# Patient Record
Sex: Female | Born: 1975 | Race: White | Hispanic: No | Marital: Married
Health system: Midwestern US, Community
[De-identification: ages and names within clinical notes are randomized; demographics above are authoritative.]

## PROBLEM LIST (undated history)

## (undated) DIAGNOSIS — Z1231 Encounter for screening mammogram for malignant neoplasm of breast: Principal | ICD-10-CM

---

## 2016-04-23 ENCOUNTER — Ambulatory Visit: Attending: Family | Primary: Family

## 2016-04-23 ENCOUNTER — Inpatient Hospital Stay: Admit: 2016-04-23 | Payer: TRICARE (CHAMPUS) | Primary: Family

## 2016-04-23 ENCOUNTER — Ambulatory Visit: Admit: 2016-04-23 | Discharge: 2016-04-23 | Payer: PRIVATE HEALTH INSURANCE | Attending: Family | Primary: Family

## 2016-04-23 DIAGNOSIS — Z7689 Persons encountering health services in other specified circumstances: Secondary | ICD-10-CM

## 2016-04-23 DIAGNOSIS — F339 Major depressive disorder, recurrent, unspecified: Secondary | ICD-10-CM

## 2016-04-23 MED ORDER — ESCITALOPRAM 20 MG TAB
20 mg | ORAL_TABLET | Freq: Every day | ORAL | 0 refills | Status: AC
Start: 2016-04-23 — End: ?

## 2016-04-23 NOTE — Patient Instructions (Signed)
Bipolar Disorder: Care Instructions  Your Care Instructions  Bipolar disorder is an illness that causes extreme mood changes, from times of very high energy (manic episodes) to times of depression. But many people with bipolar disorder show only the symptoms of depression. These moods may cause problems with your work, school, family life, friendships, and how well you function.  This disease is also called manic-depression.  There is no cure for bipolar disorder, but it can be helped with medicines. Counseling may also help. It is important to take your medicines exactly as prescribed, even when you feel well. You may need lifelong treatment.  Follow-up care is a key part of your treatment and safety. Be sure to make and go to all appointments, and call your doctor if you are having problems. It's also a good idea to know your test results and keep a list of the medicines you take.  How can you care for yourself at home?  ?? Be safe with medicines. Take your medicines exactly as prescribed. Do not stop or change a medicine without talking to your doctor first. You and your doctor may need to try different combinations of medicines to find what works for you.  ?? Take your medicines on schedule to keep your moods even. When you feel good, you may think that you do not need your medicines. But it is important to keep taking them.  ?? Go to your counseling sessions. Call and talk with your counselor if you can't go to a session or if you don't think the sessions are helping. Do not just stop going.  ?? Get at least 30 minutes of activity on most days of the week. Walking is a good choice. You also may want to do other things, such as running, swimming, or cycling.  ?? Get enough sleep. Keep your room dark and quiet. Try to go to bed at the same time every night.  ?? Eat a healthy diet. This includes whole grains, dairy, fruits, vegetables, and protein. Eat foods from each of these groups.   ?? Try to lower your stress. Manage your time, build a strong support system, and lead a healthy lifestyle. To lower your stress, try physical activity, slow deep breathing, or getting a massage.  ?? Do not use alcohol or illegal drugs.  ?? Learn the early signs of your mood changes. You can then take steps to help yourself feel better.  ?? Ask for help from friends and family when you need it. You may need help with daily chores when you are depressed. When you are manic, you may need support to control your high energy levels.  What should you do if someone in your family has bipolar disorder?  ?? Learn about the disease and the signs that it is getting worse.  ?? Remind your family member that you love him or her.  ?? Make a plan with all family members about how to take care of your loved one when his or her symptoms are bad.  ?? Talk about your fears and concerns and those of other family members. Seek counseling if needed.  ?? Do not focus attention only on the person who is in treatment.  ?? Remind yourself that it will take time for changes to occur.  ?? Do not blame yourself for the disease.  ?? Know your legal rights and the legal rights of your family member. Support groups or counselors can help you with this information.  ?? Take   care of yourself. Keep up with your own interests, such as your career, hobbies, and friends. Use exercise, positive self-talk, deep breathing, and other relaxing exercises to help lower your stress.  ?? Give yourself time to grieve. You may need to deal with emotions such as anger, fear, and frustration. After you work through your feelings, you will be better able to care for yourself and your family.  ?? If you are having a hard time with your feelings or with your relationship with your family member, talk with a counselor.  When should you call for help?  Call 911 anytime you think you may need emergency care. For example, call if:  ?? You feel like hurting yourself or someone else.   ?? Someone who has bipolar disorder displays dangerous behavior, and you think the person might hurt himself or herself or someone else.  Call your doctor now or seek immediate medical care if:  ?? You hear voices.  ?? Someone you know has bipolar disorder and talks about suicide. Keep the numbers for these national suicide hotlines: 1-800-273-TALK (763) 796-9479(1-602-805-2064) and 1-800-SUICIDE 334-068-1838(1-907-793-2069). If a suicide threat seems real, with a specific plan and a way to carry it out, stay with the person, or ask someone you trust to stay with the person, until you can get help.  ?? Someone you know has bipolar disorder and:  ?? Starts to give away possessions.  ?? Is using illegal drugs or drinking alcohol heavily.  ?? Talks or writes about death, including writing suicide notes or talking about guns, knives, or pills.  ?? Talks or writes about hurting someone else.  ?? Starts to spend a lot of time alone.  ?? Acts very aggressively or suddenly appears calm.  ?? Talks about beliefs that are not based in reality (delusions).  Watch closely for changes in your health, and be sure to contact your doctor if:  ?? You cannot go to your counseling sessions.  Where can you learn more?  Go to [employer]cahttp://www.healthwise.net/[employer]Help[employer].  Enter K052 in the search box to learn more about "Bipolar Disorder: Care Instructions."  Current as of: January 29, 2015  Content Version: 11.3  ?? 2006-2017 [employer]. Care instructions adapted under license by [employer] (which disclaims liability or warranty for this information). If you have questions about a medical condition or this instruction, always ask your healthcare professional. [employer] disclaims any warranty or liability for your use of this information.       Anxiety Disorder: Care Instructions  Your Care Instructions  Anxiety is a normal reaction to stress. Difficult situations can cause you to have symptoms such as sweaty palms and a nervous feeling.   In an anxiety disorder, the symptoms are far more severe. Constant worry, muscle tension, trouble sleeping, nausea and diarrhea, and other symptoms can make normal daily activities difficult or impossible. These symptoms may occur for no reason, and they can affect your work, school, or social life. Medicines, counseling, and self-care can all help.  Follow-up care is a key part of your treatment and safety. Be sure to make and go to all appointments, and call your doctor if you are having problems. It's also a good idea to know your test results and keep a list of the medicines you take.  How can you care for yourself at home?  ?? Take medicines exactly as directed. Call your doctor if you think you are having a problem with your medicine.  ?? Go to your  counseling sessions and follow-up appointments.  ?? Recognize and accept your anxiety. Then, when you are in a situation that makes you anxious, say to yourself, "This is not an emergency. I feel uncomfortable, but I am not in danger. I can keep going even if I feel anxious."  ?? Be kind to your body:  ?? Relieve tension with exercise or a massage.  ?? Get enough rest.  ?? Avoid alcohol, caffeine, nicotine, and illegal drugs. They can increase your anxiety level and cause sleep problems.  ?? Learn and do relaxation techniques. See below for more about these techniques.  ?? Engage your mind. Get out and do something you enjoy. Go to a funny movie, or take a walk or hike. Plan your day. Having too much or too little to do can make you anxious.  ?? Keep a record of your symptoms. Discuss your fears with a good friend or family member, or join a support group for people with similar problems. Talking to others sometimes relieves stress.  ?? Get involved in social groups, or volunteer to help others. Being alone sometimes makes things seem worse than they are.  ?? Get at least 30 minutes of exercise on most days of the week to relieve  stress. Walking is a good choice. You also may want to do other activities, such as running, swimming, cycling, or playing tennis or team sports.  Relaxation techniques  Do relaxation exercises 10 to 20 minutes a day. You can play soothing, relaxing music while you do them, if you wish.  ?? Tell others in your house that you are going to do your relaxation exercises. Ask them not to disturb you.  ?? Find a comfortable place, away from all distractions and noise.  ?? Lie down on your back, or sit with your back straight.  ?? Focus on your breathing. Make it slow and steady.  ?? Breathe in through your nose. Breathe out through either your nose or mouth.  ?? Breathe deeply, filling up the area between your navel and your rib cage. Breathe so that your belly goes up and down.  ?? Do not hold your breath.  ?? Breathe like this for 5 to 10 minutes. Notice the feeling of calmness throughout your whole body.  As you continue to breathe slowly and deeply, relax by doing the following for another 5 to 10 minutes:  ?? Tighten and relax each muscle group in your body. You can begin at your toes and work your way up to your head.  ?? Imagine your muscle groups relaxing and becoming heavy.  ?? Empty your mind of all thoughts.  ?? Let yourself relax more and more deeply.  ?? Become aware of the state of calmness that surrounds you.  ?? When your relaxation time is over, you can bring yourself back to alertness by moving your fingers and toes and then your hands and feet and then stretching and moving your entire body. Sometimes people fall asleep during relaxation, but they usually wake up shortly afterward.  ?? Always give yourself time to return to full alertness before you drive a car or do anything that might cause an accident if you are not fully alert. Never play a relaxation tape while you drive a car.  When should you call for help?  Call 911 anytime you think you may need emergency care. For example, call if:   ?? You feel you cannot stop from hurting yourself or someone else.  Keep the  numbers for these national suicide hotlines: 1-800-273-TALK 478-109-5191(1-3345563348) and 1-800-SUICIDE (432)536-3642(1-4433696358). If you or someone you know talks about suicide or feeling hopeless, get help right away.  Watch closely for changes in your health, and be sure to contact your doctor if:  ?? You have anxiety or fear that affects your life.  ?? You have symptoms of anxiety that are new or different from those you had before.  Where can you learn more?  Go to [employer]cahttp://www.healthwise.net/GoodHelpConnections.  Enter P754 in the search box to learn more about "Anxiety Disorder: Care Instructions."  Current as of: January 29, 2015  Content Version: 11.3  ?? 2006-2017 [employer]. Care instructions adapted under license by [employer] (which disclaims liability or warranty for this information). If you have questions about a medical condition or this instruction, always ask your healthcare professional. [employer] disclaims any warranty or liability for your use of this information.  Escitalopram (By mouth)   Escitalopram (es-sye-TAL-oh-pram)  Treats depression and generalized anxiety disorder (GAD).   Brand Name(s): Lexapro   There may be other brand names for this medicine.  When This Medicine Should Not Be Used:   This medicine is not right for everyone. Do not use it if you had an allergic reaction to escitalopram or citalopram.  How to Use This Medicine:   Liquid, Tablet  ?? Take this medicine as directed. You may need to take it for a month or more before you feel better. Your dose may need to be changed to find out what works best for you.  ?? Measure the oral liquid medicine with a marked measuring spoon, oral syringe, or medicine cup.  ?? This medicine should come with a Medication Guide. Ask your pharmacist for a copy if you do not have one.  ?? Missed dose: Take a dose as soon as you remember. If it is almost time  for your next dose, wait until then and take a regular dose. Do not take extra medicine to make up for a missed dose.  ?? Store the medicine in a closed container at room temperature, away from heat, moisture, and direct light.  Drugs and Foods to Avoid:   Ask your doctor or pharmacist before using any other medicine, including over-the-counter medicines, vitamins, and herbal products.  ?? Do not use this medicine together with pimozide. Do not use this medicine and an MAO inhibitor (MAOI) within 14 days of each other.  ?? Some medicines can affect how escitalopram works. Tell your doctor if you are using the following:   ?? Buspirone, carbamazepine, fentanyl, lithium, St John's wort, tramadol, or tryptophan supplements  ?? Amphetamines  ?? Blood thinner (including warfarin)  ?? Diuretic (water pill)  ?? NSAID pain or arthritis medicine (including aspirin, celecoxib, diclofenac, ibuprofen, naproxen)  ?? Triptan medicine to treat migraine headaches (including sumatriptan)  ?? Tell your doctor if you use anything else that makes you sleepy. Some examples are allergy medicine, narcotic pain medicine, and alcohol.  ?? Do not drink alcohol while you are using this medicine.  Warnings While Using This Medicine:   ?? Tell your doctor if you are pregnant or breastfeeding, or if you have kidney disease, liver disease, bleeding problems, glaucoma, heart disease, or a seizure disorder.  ?? For some children, teenagers, and young adults, this medicine may increase mental or emotional problems. This may lead to thoughts of suicide and violence. Talk with your doctor right away if you have any thoughts or behavior changes that  concern you. Tell your doctor if you or anyone in your family has a history of bipolar disorder or suicide attempts.  ?? This medicine may cause the following problems:   ?? Serotonin syndrome (more likely when taken with certain medicines)  ?? Low sodium levels  ?? Increased risk of bleeding problems   ?? This medicine may make you dizzy or drowsy. Do not drive or do anything that could be dangerous until you know how this medicine affects you.  ?? Your doctor may want to monitor your child's weight and height, because this medicine may cause decreased appetite and weight loss in children.  ?? Do not stop using this medicine suddenly. Your doctor will need to slowly decrease your dose before you stop it completely.  ?? Your doctor will check your progress and the effects of this medicine at regular visits. Keep all appointments.  ?? Keep all medicine out of the reach of children. Never share your medicine with anyone.  Possible Side Effects While Using This Medicine:   Call your doctor right away if you notice any of these side effects:  ?? Allergic reaction: Itching or hives, swelling in your face or hands, swelling or tingling in your mouth or throat, chest tightness, trouble breathing  ?? Anxiety, restlessness, fever, sweating, muscle spasms, nausea, vomiting, diarrhea, seeing or hearing things that are not there  ?? Confusion, weakness, and muscle twitching  ?? Eye pain, vision changes, seeing halos around lights  ?? Fast, pounding, or uneven heartbeat  ?? Feeling more excited or energetic than usual, racing thoughts, trouble sleeping  ?? Seizures  ?? Thoughts of hurting yourself or others, unusual behavior  ?? Unusual bleeding or bruising  If you notice these less serious side effects, talk with your doctor:   ?? Dizziness, drowsiness, or sleepiness  ?? Dry mouth  ?? Headache  ?? Nausea, constipation, diarrhea  ?? Sexual problems  If you notice other side effects that you think are caused by this medicine, tell your doctor.   Call your doctor for medical advice about side effects. You may report side effects to FDA at 1-800-FDA-1088  ?? 2017 [facility] Information is for End User's use only and may not be sold, redistributed or otherwise used for commercial purposes.   The above information is an educational aid only. It is not intended as medical advice for individual conditions or treatments. Talk to your doctor, nurse or pharmacist before following any medical regimen to see if it is safe and effective for you.   Escitalopram (Lexapro) - (By mouth)   Why this medicine is used:   Treats depression and generalized anxiety disorder (GAD).  Contact a nurse or doctor right away if you have:  ?? Fast, pounding, or uneven heartbeat; lightheadedness or fainting  ?? Thoughts of hurting yourself, seeing or hearing things that are not there  ?? Anxiety, restlessness, fever, sweating, muscle spasms  ?? Bloody vomit or vomit that looks like coffee grounds; bloody or black, tarry stools  ?? Confusion, weakness, and muscle twitching     Common side effects:  ?? Blurred vision, dizziness, sleepiness, headache  ?? Sexual problems  ?? Constipation, diarrhea, nausea, vomiting, dry mouth  ?? 2017 [facility] Information is for End User's use only and may not be sold, redistributed or otherwise used for commercial purposes.

## 2016-04-23 NOTE — Progress Notes (Signed)
Contacted Pt regarding previous note. Informed Pt of previous note. Pt verbalized understanding.

## 2016-04-23 NOTE — Progress Notes (Signed)
1. Have you been to the ER, urgent care clinic since your last visit?  Hospitalized since your last visit?No    2. Have you seen or consulted any other [facility] providers [facility] since your last visit?  Include any pap smears or colon screening. No    Is someone accompanying this pt? no    Is the patient using any DME equipment during OV? no      Chief Complaint   Patient presents with   ??? Establish Care     Pt being see today for fatigue. 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 Pt states that she has been feeling fatigued since DX. Pt states that she is so tired it hurts. Pt has been taking naps throughout the day and is still exhausted.

## 2016-04-23 NOTE — Progress Notes (Signed)
HPI  Theresa Warner is a 40 y.o. female  Chief Complaint   Patient presents with   ??? Establish Care     Pt being see today for fatigue. 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 Pt states that she has been feeling fatigued since DX. Pt states that she is so tired it hurts. Pt has been taking naps throughout the day and is still exhausted.   Reports PCP needed to change due to insurance.   Reports she is being [facility] by [facility] her mono. Reports being diagnosed about 4 weeks ago. States she thought her symptoms would be gone in two weeks. Reports she is still fatigued. Denies cough, sore throat, fevers, chills, abdominal pain, nausea, vomiting, or diarrhea. Denies allergies but reports nasal passage irritation. But does not know if this is stressed related. Reports a history of Bipolar, General Anxiety, and Major Depressive disorder. Denies any hospitalization. Denies desire to hurt or harm self or others. Denies suicidal ideations. Admits to sleep apnea and states she uses a CPAP at night.   Reports Clovis Rileyheryl Clark at [facility] manages her Lexapro. Reports son has run away one week ago and she is stressed out. Reports her son is Bipolar and she is very concerned. Reports appointment with Clovis Rileyheryl Clark  on Nov.1st but needs medication to get her to her appointment. Reports she is trying to stay healthy but is concerned that she may run out of medication. Reports she is on 30 mg dose of Lexapro but states she has only been taking 20 because she has been trying to save her medication and ensure they last.         Past Medical History  Past Medical History:   Diagnosis Date   ??? Anxiety    ??? Bipolar 1 disorder (HCC)    ??? Depression        Surgical History  Past Surgical History:   Procedure Laterality Date   ??? HX HYSTERECTOMY          Medications      Allergies  No Known Allergies    Family History  Family History   Problem Relation Age of Onset    ??? Diabetes Mother    ??? Cancer Father      melanoma   ??? Psychiatric Disorder Father    ??? Stroke Father        Social History  Social History     Social History   ??? Marital status: MARRIED     Spouse name: N/A   ??? Number of children: N/A   ??? Years of education: N/A     Occupational History   ??? Not on file.     Social History Main Topics   ??? Smoking status: Never Smoker   ??? Smokeless tobacco: Never Used   ??? Alcohol use Yes      Comment: ocassionally   ??? Drug use: No   ??? Sexual activity: Not on file     Other Topics Concern   ??? Not on file     Social History Narrative   ??? No narrative on file       Problem List  There is no problem list on file for this patient.      Review of Systems  Review of Systems   Constitutional: Positive for malaise/fatigue. Negative for chills and fever.   HENT: Negative for congestion, ear pain, hearing loss, sinus pain and sore throat.  Eyes: Negative for pain.   Respiratory: Negative for cough and shortness of breath.    Cardiovascular: Negative for chest pain and palpitations.   Gastrointestinal: Negative for abdominal pain, blood in stool, constipation, diarrhea, nausea and vomiting.   Genitourinary: Negative for hematuria.   Neurological: Negative for dizziness, speech change, seizures and loss of consciousness.   Psychiatric/Behavioral: Positive for depression (on medications chronic). Negative for hallucinations, substance abuse and suicidal ideas. The patient is nervous/anxious (ongoing - chronic on medications) and has insomnia (sleep apnea).        Vital Signs  Vitals:    04/23/16 1021   BP: 106/71   Pulse: 74   Temp: 98.5 ??F (36.9 ??C)   TempSrc: Oral   SpO2: 99%   Weight: 244 lb (110.7 kg)   Height: 5\' 5"  (1.651 m)   PainSc:   0 - No pain       Physical Exam  Physical Exam   Constitutional: She is oriented to person, place, and time.   HENT:   Right Ear: Tympanic membrane and ear canal normal.   Left Ear: Tympanic membrane and ear canal normal.    Nose: Rhinorrhea present. Right sinus exhibits no maxillary sinus tenderness and no frontal sinus tenderness. Left sinus exhibits no maxillary sinus tenderness and no frontal sinus tenderness.   Mouth/Throat: Oropharynx is clear and moist.   Eyes: Conjunctivae and EOM are normal. Pupils are equal, round, and reactive to light.   Neck: Normal range of motion. No JVD present. No thyromegaly present.   Cardiovascular: Normal rate, regular rhythm and normal heart sounds.    No murmur heard.  Pulmonary/Chest: Effort normal and breath sounds normal. No respiratory distress. She has no wheezes.   Abdominal: Soft. Bowel sounds are normal. She exhibits no distension. There is no tenderness.   Lymphadenopathy:     She has no cervical adenopathy.   Neurological: She is alert and oriented to person, place, and time.   Skin:   Right upper thigh scratch with no redness or erythema. Healing.    Psychiatric: She has a normal mood and affect. Her behavior is normal. Judgment and thought content normal.   Vitals reviewed.      Diagnostics  Orders Placed This Encounter   ??? MAM MAMMO BI SCREENING INCL CAD     Standing Status:   Future     Standing Expiration Date:   05/24/2017     Order Specific Question:   Reason for Exam     Answer:   annual     Order Specific Question:   Is Patient Pregnant?     Answer:   Unknown   ??? CBC WITH AUTOMATED DIFF     Standing Status:   Future     Standing Expiration Date:   04/24/2017   ??? METABOLIC PANEL, COMPREHENSIVE     Standing Status:   Future     Standing Expiration Date:   10/21/2016   ??? LIPID PANEL     Standing Status:   Future     Standing Expiration Date:   10/21/2016   ??? HEMOGLOBIN A1C WITH EAG     Standing Status:   Future     Standing Expiration Date:   04/24/2017   ??? VITAMIN D, 25 HYDROXY     Standing Status:   Future     Standing Expiration Date:   04/23/2017   ??? TSH 3RD GENERATION     Standing Status:   Future  Standing Expiration Date:   10/21/2016   ??? T4, FREE      Standing Status:   Future     Standing Expiration Date:   04/24/2017   ??? escitalopram oxalate (LEXAPRO) 10 mg tablet     Sig: Take 10 mg by mouth daily.   ??? DISCONTD: escitalopram oxalate (LEXAPRO) 20 mg tablet     Sig: Take 20 mg by mouth daily.   ??? LORazepam (ATIVAN) 1 mg tablet     Sig: Take 1 mg by mouth every six (6) hours as needed for Anxiety.   ??? escitalopram oxalate (LEXAPRO) 20 mg tablet     Sig: Take 1 Tab by mouth daily.     Dispense:  30 Tab     Refill:  0       Results  No results found for this or any previous visit.      Assessment and Plan  Diagnoses and all orders for this visit:    1. Encounter to establish care    2. Recurrent major depressive disorder, remission status unspecified (HCC)  -     escitalopram oxalate (LEXAPRO) 20 mg tablet; Take 1 Tab by mouth daily.  -     CBC WITH AUTOMATED DIFF; Future  -     TSH 3RD GENERATION; Future  -     T4, FREE; Future    3. Anxiety  -     escitalopram oxalate (LEXAPRO) 20 mg tablet; Take 1 Tab by mouth daily.    4. Bipolar affective disorder, current episode mixed, current episode severity unspecified (HCC)  -     escitalopram oxalate (LEXAPRO) 20 mg tablet; Take 1 Tab by mouth daily.    5. BMI 40.0-44.9, adult (HCC)  -     CBC WITH AUTOMATED DIFF; Future  -     METABOLIC PANEL, COMPREHENSIVE; Future  -     LIPID PANEL; Future  -     HEMOGLOBIN A1C WITH EAG; Future  -     VITAMIN D, 25 HYDROXY; Future  -     TSH 3RD GENERATION; Future  -     T4, FREE; Future    6. Screening for breast cancer  -     MAM MAMMO BI SCREENING INCL CAD; Future    7. Fatigue, unspecified type  -     CBC WITH AUTOMATED DIFF; Future  -     METABOLIC PANEL, COMPREHENSIVE; Future  -     LIPID PANEL; Future  -     HEMOGLOBIN A1C WITH EAG; Future  -     VITAMIN D, 25 HYDROXY; Future  -     TSH 3RD GENERATION; Future  -     T4, FREE; Future    8. Acute allergic rhinitis, unspecified seasonality, unspecified trigger       Discussed black box warning with patient on Lexapro. Informed patient that 20 mg dose was recommended and that I would not order 30mg  since she has been taking 20 mg. Informed patient that I would give her a 30 day supply which would last her till she sees [facility]. Educated on Mono and importance of rest and hydration. Discussed BMI including diet, and lifestyle changes. Patient agrees to make diet modifications and states she had planned to start low fat fruit smoothies and supplements. Patient agrees to walk after putting her children on the bus in the mornings. Patient to take OTC loratadine for allergies.  After care summary printed and reviewed with patient.  Plan reviewed with patient. Questions answered. Patient verbalized understanding of plan and is in agreement with plan. Patient to follow up in two weeks or earlier if symptoms worsen.     Julianne Handler, FNP-C

## 2016-04-23 NOTE — Progress Notes (Signed)
Please contact patient and inform her that glucose and hemoglobin A1C was elevated A1C 6.0 indicating prediabetes. Instruct her that she should decrease junk foods and sugary snacks and drinks. We will discuss this further at her next visit. Inform her that her LDL is elevated at 118.2. Initiate lifestyle changes such as exercise at least 3 times a week at 45 min intervals along with a diet low in saturated fats. Increase soluble fiber in diet such as fruit and vegetables will help lower LDL. All othelipid values were in normal range.   All other labs are normal, near normal, or are of no clinical concern at this time.

## 2016-04-24 LAB — LIPID PANEL
CHOL/HDL Ratio: 4.2 (ref 0–5.0)
Cholesterol, total: 187 MG/DL (ref <200)
HDL Cholesterol: 45 MG/DL (ref 40–60)
LDL, calculated: 118.2 MG/DL — ABNORMAL HIGH (ref 0–100)
Triglyceride: 119 MG/DL (ref <150)
VLDL, calculated: 23.8 MG/DL

## 2016-04-24 LAB — CBC WITH AUTOMATED DIFF
ABS. BASOPHILS: 0.1 10*3/uL — ABNORMAL HIGH (ref 0.0–0.06)
ABS. EOSINOPHILS: 0.1 10*3/uL (ref 0.0–0.4)
ABS. LYMPHOCYTES: 3.4 10*3/uL (ref 0.9–3.6)
ABS. MONOCYTES: 0.5 10*3/uL (ref 0.05–1.2)
ABS. NEUTROPHILS: 6 10*3/uL (ref 1.8–8.0)
BASOPHILS: 1 % (ref 0–2)
EOSINOPHILS: 1 % (ref 0–5)
HCT: 40.6 % (ref 35.0–45.0)
HGB: 13.1 g/dL (ref 12.0–16.0)
LYMPHOCYTES: 34 % (ref 21–52)
MCH: 28.1 PG (ref 24.0–34.0)
MCHC: 32.3 g/dL (ref 31.0–37.0)
MCV: 86.9 FL (ref 74.0–97.0)
MONOCYTES: 5 % (ref 3–10)
MPV: 10 FL (ref 9.2–11.8)
NEUTROPHILS: 59 % (ref 40–73)
PLATELET: 275 10*3/uL (ref 135–420)
RBC: 4.67 M/uL (ref 4.20–5.30)
RDW: 13.9 % (ref 11.6–14.5)
WBC: 10 10*3/uL (ref 4.6–13.2)

## 2016-04-24 LAB — METABOLIC PANEL, COMPREHENSIVE
A-G Ratio: 1.1 (ref 0.8–1.7)
ALT (SGPT): 40 U/L (ref 13–56)
AST (SGOT): 31 U/L (ref 15–37)
Albumin: 3.9 g/dL (ref 3.4–5.0)
Alk. phosphatase: 80 U/L (ref 45–117)
Anion gap: 10 mmol/L (ref 3.0–18)
BUN/Creatinine ratio: 19 (ref 12–20)
BUN: 13 MG/DL (ref 7.0–18)
Bilirubin, total: 0.4 MG/DL (ref 0.2–1.0)
CO2: 29 mmol/L (ref 21–32)
Calcium: 8.9 MG/DL (ref 8.5–10.1)
Chloride: 101 mmol/L (ref 100–108)
Creatinine: 0.68 MG/DL (ref 0.6–1.3)
GFR est AA: >60 mL/min/{1.73_m2} (ref >60)
GFR est non-AA: >60 mL/min/{1.73_m2} (ref >60)
Globulin: 3.5 g/dL (ref 2.0–4.0)
Glucose: 112 mg/dL — ABNORMAL HIGH (ref 74–99)
Potassium: 4.1 mmol/L (ref 3.5–5.5)
Protein, total: 7.4 g/dL (ref 6.4–8.2)
Sodium: 140 mmol/L (ref 136–145)

## 2016-04-24 LAB — TSH 3RD GENERATION: TSH: 1.47 u[IU]/mL (ref 0.36–3.74)

## 2016-04-24 LAB — VITAMIN D, 25 HYDROXY: Vitamin D 25-Hydroxy: 36 ng/mL (ref 30–100)

## 2016-04-24 LAB — HEMOGLOBIN A1C WITH EAG
Est. average glucose: 126 mg/dL
Hemoglobin A1c: 6 % — ABNORMAL HIGH (ref 4.2–5.6)

## 2016-04-24 LAB — T4, FREE: T4, Free: 1 NG/DL (ref 0.7–1.5)

## 2016-05-07 ENCOUNTER — Encounter: Attending: Family | Primary: Family

## 2016-05-07 ENCOUNTER — Ambulatory Visit: Admit: 2016-05-07 | Discharge: 2016-05-07 | Payer: PRIVATE HEALTH INSURANCE | Attending: Family | Primary: Family

## 2016-05-07 DIAGNOSIS — Z712 Person consulting for explanation of examination or test findings: Secondary | ICD-10-CM

## 2016-05-07 NOTE — Progress Notes (Signed)
Chief Complaint   Patient presents with   ??? Depression   ??? Anxiety   ??? Bipolar   ??? Fatigue   ??? Labs     1. Have you been to the ER, urgent care clinic since your last visit?  Hospitalized since your last visit?No    2. Have you seen or consulted any other health care providers outside of the [facility] since your last visit?  Include any pap smears or colon screening. Yes When: 05/06/16 Where: psychologist Reason for visit: medication check

## 2016-05-07 NOTE — Patient Instructions (Signed)
Please contact our office if you have any questions about your visit today.

## 2016-05-07 NOTE — Progress Notes (Signed)
HPI  Theresa Warner is a 40 y.o. female  Chief Complaint   Patient presents with   ??? Depression   ??? Anxiety   ??? Bipolar   ??? Fatigue   ??? Labs     Reports she has seen a psychiatrist who will be ordering her Lexapro.  Denies desire to hurt or harm self or others. Denies suicidal ideations.  Reports she has a lot of stress and a lot to do during the day.  Requesting lab results. Admits they have called her to schedule her mammogram but states she has not had time to call them back.     Past Medical History  Past Medical History:   Diagnosis Date   ??? Anxiety    ??? Bipolar 1 disorder ([place])    ??? Depression        Surgical History  Past Surgical History:   Procedure Laterality Date   ??? HX HYSTERECTOMY          Medications  Current Outpatient Prescriptions   Medication Sig Dispense Refill   ??? loratadine (CLARITIN) 10 mg tablet Take 10 mg by mouth daily.     ??? escitalopram oxalate (LEXAPRO) 10 mg tablet Take 10 mg by mouth daily.     ??? LORazepam (ATIVAN) 1 mg tablet Take 1 mg by mouth every six (6) hours as needed for Anxiety.     ??? escitalopram oxalate (LEXAPRO) 20 mg tablet Take 1 Tab by mouth daily. 30 Tab 0       Allergies  No Known Allergies    Family History  Family History   Problem Relation Age of Onset   ??? Diabetes Mother    ??? Cancer Father      melanoma   ??? Psychiatric Disorder Father    ??? Stroke Father        Social History  Social History     Social History   ??? Marital status: MARRIED     Spouse name: N/A   ??? Number of children: N/A   ??? Years of education: N/A     Occupational History   ??? Not on file.     Social History Main Topics   ??? Smoking status: Never Smoker   ??? Smokeless tobacco: Never Used   ??? Alcohol use Yes      Comment: ocassionally   ??? Drug use: No   ??? Sexual activity: Not on file     Other Topics Concern   ??? Not on file     Social History Narrative       Problem List  There is no problem list on file for this patient.      Review of Systems  Review of Systems    Constitutional: Positive for malaise/fatigue. Negative for chills and fever.   HENT: Negative for congestion and sore throat.    Respiratory: Negative for cough and shortness of breath.    Cardiovascular: Negative for chest pain.   Gastrointestinal: Negative for abdominal pain, nausea and vomiting.   Psychiatric/Behavioral: Positive for depression (on going treatment). Negative for substance abuse and suicidal ideas. The patient is nervous/anxious (ongoing treatment).        Vital Signs  Vitals:    05/07/16 0851   BP: 107/61   Pulse: (!) 56   Temp: 96.6 ??F (35.9 ??C)   TempSrc: Oral   SpO2: 99%   Weight: 243 lb (110.2 kg)   Height: 5' 5"  (1.651 m)   PainSc:   0 -  No pain       Physical Exam  Physical Exam   Constitutional: She is oriented to person, place, and time.   HENT:   Right Ear: External ear normal.   Left Ear: External ear normal.   Mouth/Throat: Oropharynx is clear and moist.   Cardiovascular: Normal rate, regular rhythm and normal heart sounds.    No murmur heard.  Pulmonary/Chest: Effort normal and breath sounds normal. No respiratory distress. She has no wheezes.   Abdominal: Soft. Bowel sounds are normal. She exhibits no distension. There is no tenderness.   Neurological: She is alert and oriented to person, place, and time.   Skin: Skin is warm and dry.   Psychiatric: She has a normal mood and affect. Her behavior is normal.   Vitals reviewed.      Diagnostics  Orders Placed This Encounter   ??? loratadine (CLARITIN) 10 mg tablet     Sig: Take 10 mg by mouth daily.       Results  Results for orders placed or performed during the hospital encounter of 04/23/16   CBC WITH AUTOMATED DIFF   Result Value Ref Range    WBC 10.0 4.6 - 13.2 K/uL    RBC 4.67 4.20 - 5.30 M/uL    HGB 13.1 12.0 - 16.0 g/dL    HCT 40.6 35.0 - 45.0 %    MCV 86.9 74.0 - 97.0 FL    MCH 28.1 24.0 - 34.0 PG    MCHC 32.3 31.0 - 37.0 g/dL    RDW 13.9 11.6 - 14.5 %    PLATELET 275 135 - 420 K/uL    MPV 10.0 9.2 - 11.8 FL     NEUTROPHILS 59 40 - 73 %    LYMPHOCYTES 34 21 - 52 %    MONOCYTES 5 3 - 10 %    EOSINOPHILS 1 0 - 5 %    BASOPHILS 1 0 - 2 %    ABS. NEUTROPHILS 6.0 1.8 - 8.0 K/UL    ABS. LYMPHOCYTES 3.4 0.9 - 3.6 K/UL    ABS. MONOCYTES 0.5 0.05 - 1.2 K/UL    ABS. EOSINOPHILS 0.1 0.0 - 0.4 K/UL    ABS. BASOPHILS 0.1 (H) 0.0 - 0.06 K/UL    DF AUTOMATED     METABOLIC PANEL, COMPREHENSIVE   Result Value Ref Range    Sodium 140 136 - 145 mmol/L    Potassium 4.1 3.5 - 5.5 mmol/L    Chloride 101 100 - 108 mmol/L    CO2 29 21 - 32 mmol/L    Anion gap 10 3.0 - 18 mmol/L    Glucose 112 (H) 74 - 99 mg/dL    BUN 13 7.0 - 18 MG/DL    Creatinine 0.68 0.6 - 1.3 MG/DL    BUN/Creatinine ratio 19 12 - 20      GFR est AA >60 >60 ml/min/1.16m    GFR est non-AA >60 >60 ml/min/1.762m   Calcium 8.9 8.5 - 10.1 MG/DL    Bilirubin, total 0.4 0.2 - 1.0 MG/DL    ALT (SGPT) 40 13 - 56 U/L    AST (SGOT) 31 15 - 37 U/L    Alk. phosphatase 80 45 - 117 U/L    Protein, total 7.4 6.4 - 8.2 g/dL    Albumin 3.9 3.4 - 5.0 g/dL    Globulin 3.5 2.0 - 4.0 g/dL    A-G Ratio 1.1 0.8 - 1.7     LIPID PANEL  Result Value Ref Range    LIPID PROFILE          Cholesterol, total 187 <200 MG/DL    Triglyceride 119 <150 MG/DL    HDL Cholesterol 45 40 - 60 MG/DL    LDL, calculated 118.2 (H) 0 - 100 MG/DL    VLDL, calculated 23.8 MG/DL    CHOL/HDL Ratio 4.2 0 - 5.0     HEMOGLOBIN A1C WITH EAG   Result Value Ref Range    Hemoglobin A1c 6.0 (H) 4.2 - 5.6 %    Est. average glucose 126 mg/dL   VITAMIN D, 25 HYDROXY   Result Value Ref Range    Vitamin D 25-Hydroxy 36.0 30 - 100 ng/mL   TSH 3RD GENERATION   Result Value Ref Range    TSH 1.47 0.36 - 3.74 uIU/mL   T4, FREE   Result Value Ref Range    T4, Free 1.0 0.7 - 1.5 NG/DL           Assessment and Plan  Diagnoses and all orders for this visit:    1. Encounter to discuss test results    2. BMI 40.0-44.9, adult ([place])    3. Anxiety    4. Fatigue, unspecified type        BMI - Agrees to exercise 4-5 times daily for at least 45 min intervals. Agrees to continue eating healthy as she is on 8 day challenge that she eats 5 meals a day and has increased protein and vegetables. Discussed foods that will also help lower her cholesterol and bring Hemoglobin A1C down.   Discussed stress and how it could contribute to fatigue. Encouraged exercise to help reduce stress.   After care summary printed and reviewed with patient. Plan reviewed with patient. Questions answered. Patient verbalized understanding of plan and is in agreement with plan. Patient to follow up in three month or earlier if symptoms worsen or do not improve. Will complete well woman and labs at next visit.     Gwendolyn Fill, FNP-C

## 2016-05-19 ENCOUNTER — Ambulatory Visit

## 2016-05-19 ENCOUNTER — Inpatient Hospital Stay: Admit: 2016-05-19 | Payer: TRICARE (CHAMPUS) | Attending: Family | Primary: Family

## 2016-05-19 DIAGNOSIS — Z1231 Encounter for screening mammogram for malignant neoplasm of breast: Secondary | ICD-10-CM

## 2016-05-19 NOTE — Progress Notes (Signed)
Mammogram letter sent to the patient.

## 2016-05-20 NOTE — Progress Notes (Signed)
Please let patient know her mammo indicated areas of breast density with benign findings. She should have annual mammograms. JHughes,FNp-C

## 2016-07-20 ENCOUNTER — Encounter: Attending: Family | Primary: Family
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
